# Patient Record
Sex: Male | Born: 2000 | Race: White | Hispanic: No | Marital: Single | Smoking: Current every day smoker
Health system: Southern US, Community
[De-identification: ages and names within clinical notes are randomized; demographics above are authoritative.]

---
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
## 2018-07-17 ENCOUNTER — Encounter (HOSPITAL_COMMUNITY): Payer: Self-pay

## 2018-07-17 ENCOUNTER — Ambulatory Visit: Payer: Self-pay

## 2018-07-17 ENCOUNTER — Emergency Department (HOSPITAL_COMMUNITY)
Admission: EM | Admit: 2018-07-17 | Discharge: 2018-07-17 | Disposition: A | Discharge status: Home / Self Care | Payer: HRSA Program | Attending: Emergency Medicine | Admitting: Emergency Medicine

## 2018-07-17 ENCOUNTER — Other Ambulatory Visit: Payer: Self-pay

## 2018-07-17 ENCOUNTER — Emergency Department (HOSPITAL_COMMUNITY): Payer: HRSA Program

## 2018-07-17 DIAGNOSIS — R197 Diarrhea, unspecified: Secondary | ICD-10-CM | POA: Insufficient documentation

## 2018-07-17 DIAGNOSIS — R05 Cough: Secondary | ICD-10-CM | POA: Insufficient documentation

## 2018-07-17 DIAGNOSIS — R059 Cough, unspecified: Secondary | ICD-10-CM

## 2018-07-17 DIAGNOSIS — Z20828 Contact with and (suspected) exposure to other viral communicable diseases: Secondary | ICD-10-CM | POA: Diagnosis not present

## 2018-07-17 DIAGNOSIS — Z20822 Contact with and (suspected) exposure to covid-19: Secondary | ICD-10-CM

## 2018-07-17 LAB — CBC WITH DIFFERENTIAL/PLATELET
Abs Immature Granulocytes: 0.01 10*3/uL (ref 0.00–0.07)
Basophils Absolute: 0 10*3/uL (ref 0.0–0.1)
Basophils Relative: 1 %
Eosinophils Absolute: 0.1 10*3/uL (ref 0.0–0.5)
Eosinophils Relative: 1 %
HCT: 49.1 % (ref 39.0–52.0)
Hemoglobin: 16.4 g/dL (ref 13.0–17.0)
Immature Granulocytes: 0 %
Lymphocytes Relative: 32 %
Lymphs Abs: 2 10*3/uL (ref 0.7–4.0)
MCH: 29.5 pg (ref 26.0–34.0)
MCHC: 33.4 g/dL (ref 30.0–36.0)
MCV: 88.5 fL (ref 80.0–100.0)
Monocytes Absolute: 0.4 10*3/uL (ref 0.1–1.0)
Monocytes Relative: 6 %
Neutro Abs: 3.8 10*3/uL (ref 1.7–7.7)
Neutrophils Relative %: 60 %
Platelets: 208 10*3/uL (ref 150–400)
RBC: 5.55 MIL/uL (ref 4.22–5.81)
RDW: 12.2 % (ref 11.5–15.5)
WBC: 6.3 10*3/uL (ref 4.0–10.5)
nRBC: 0 % (ref 0.0–0.2)

## 2018-07-17 LAB — COMPREHENSIVE METABOLIC PANEL
ALT: 16 U/L (ref 0–44)
AST: 19 U/L (ref 15–41)
Albumin: 4.7 g/dL (ref 3.5–5.0)
Alkaline Phosphatase: 58 U/L (ref 38–126)
Anion gap: 12 (ref 5–15)
BUN: 14 mg/dL (ref 6–20)
CO2: 22 mmol/L (ref 22–32)
Calcium: 9.6 mg/dL (ref 8.9–10.3)
Chloride: 106 mmol/L (ref 98–111)
Creatinine, Ser: 0.84 mg/dL (ref 0.61–1.24)
GFR calc Af Amer: >60 mL/min (ref >60)
GFR calc non Af Amer: >60 mL/min (ref >60)
Glucose, Bld: 78 mg/dL (ref 70–99)
Potassium: 4 mmol/L (ref 3.5–5.1)
Sodium: 140 mmol/L (ref 135–145)
Total Bilirubin: 0.5 mg/dL (ref 0.3–1.2)
Total Protein: 7.7 g/dL (ref 6.5–8.1)

## 2018-07-17 MED ORDER — SODIUM CHLORIDE 0.9 % IV BOLUS
1000.0000 mL | Freq: Once | INTRAVENOUS | Status: AC
  Administered 2018-07-17: 1000 mL via INTRAVENOUS

## 2018-07-17 NOTE — Discharge Instructions (Signed)
You do have a coronavirus test pending.  Please continue to quarantine at home and monitor your symptoms closely until your test results return in about 2 days.  You chest x-ray was clear and labs look good. Antibiotics are not helpful in treating viral infection, the virus should run its course in about 5-7 days. Please make sure you are drinking plenty of fluids. You can treat your symptoms supportively with tylenol for fevers and pains, and over the counter cough syrups and throat lozenges to help with cough.  For diarrhea if it is worsening you can use Imodium, you can also try taking a probiotic supplement i.  F your symptoms are not improving please follow up with you Primary doctor.   I recommend that you purchase a home pulse ox to help better monitor your oxygen at home, if you start to have increased work of breathing or shortness of breath or your oxygen drops below 90% please immediately return to the hospital for reevaluation.  If you develop persistent fevers, shortness of breath or difficulty breathing, chest pain, severe headache and neck pain, persistent nausea and vomiting or other new or concerning symptoms return to the Emergency department.

## 2018-07-17 NOTE — ED Triage Notes (Signed)
Sore throat since this morning, diarrhea x 10 days, dry cough. Pt has been exposed to [place] at work at [employer] and [employer].

## 2018-07-17 NOTE — ED Notes (Signed)
Pt is calm and relaxing in room at this time. Pt appears comfortable and without distress.Pt reports that he has been having no problems eating and drinking at this time.

## 2018-07-17 NOTE — ED Provider Notes (Signed)
[facility] DEPT Provider Note   CSN: [id number] Arrival date & time: 07/17/18  1357    History   Chief Complaint Chief Complaint  Patient presents with   Cough   Diarrhea    HPI Zachary Oconnor is a 18 y.o. male.     Zachary Oconnor is a 18 y.o. male otherwise healthy, presents to the emergency department for cough, sore throat and diarrhea.  Patient reports he works for [employer] and then has had a few positive COVID cases at work.  He reports diarrhea for the past 10 days which is been nonbloody.  He does report that he occasionally has blood on the toilet paper which is related to his hemorrhoids.  He reports a few days ago he developed a cough and today started to have a sore throat, denies congestion.  He denies chest pain or shortness of breath.  No abdominal pain, nausea or vomiting, has had some chills and fatigue but no fever.  No other aggravating or alleviating factors.  He reports that he 3-4 watery bowel movements per day but is still able to drink and eat normally.     History reviewed. No pertinent past medical history.  There are no active problems to display for this patient.   History reviewed. No pertinent surgical history.      Home Medications    Prior to Admission medications   Medication Sig Start Date End Date Taking? Authorizing Provider  permethrin (ELIMITE) 5 % cream Use as instructed on package 10/20/12   Billy Fischer, MD    Family History No family history on file.  Social History Social History   Tobacco Use   Smoking status: Current Every Day Smoker    Packs/day: 0.50   Smokeless tobacco: Never Used  Substance Use Topics   Alcohol use: No   Drug use: Never     Allergies   Patient has no known allergies.   Review of Systems Review of Systems  Constitutional: Positive for chills and fatigue. Negative for fever.  HENT: Positive for sore throat. Negative for congestion and rhinorrhea.    Respiratory: Positive for cough. Negative for shortness of breath.   Cardiovascular: Negative for chest pain.  Gastrointestinal: Positive for diarrhea. Negative for abdominal pain, blood in stool, constipation, nausea and vomiting.  Genitourinary: Negative for dysuria and frequency.  Musculoskeletal: Positive for myalgias. Negative for arthralgias.  Skin: Negative for color change and rash.  Neurological: Positive for headaches. Negative for weakness and numbness.  All other systems reviewed and are negative.    Physical Exam Updated Vital Signs BP 122/75 (BP Location: Right Arm)    Pulse 69    Temp 98.3 F (36.8 C) (Oral)    Resp 17    Ht 5\' 9"  (1.753 m)    Wt 59 kg    SpO2 98%    BMI 19.20 kg/m   Physical Exam Vitals signs and nursing note reviewed.  Constitutional:      General: He is not in acute distress.    Appearance: Normal appearance. He is well-developed and normal weight. He is not ill-appearing or diaphoretic.  HENT:     Head: Normocephalic and atraumatic.     Nose: Nose normal.     Mouth/Throat:     Mouth: Mucous membranes are moist.     Pharynx: Oropharynx is clear.     Comments: Posterior oropharynx clear and mucous membranes moist, there is mild erythema but no edema or tonsillar exudates, uvula  midline, normal phonation, no trismus, tolerating secretions without difficulty. Eyes:     General:        Right eye: No discharge.        Left eye: No discharge.  Neck:     Musculoskeletal: Neck supple.     Comments: No rigidity Cardiovascular:     Rate and Rhythm: Normal rate and regular rhythm.     Heart sounds: Normal heart sounds.  Pulmonary:     Effort: Pulmonary effort is normal. No respiratory distress.     Breath sounds: Normal breath sounds.     Comments: Respirations equal and unlabored, patient able to speak in full sentences, lungs clear to auscultation bilaterally Abdominal:     General: Bowel sounds are normal. There is no distension.      Palpations: Abdomen is soft. There is no mass.     Tenderness: There is no abdominal tenderness. There is no guarding.     Comments: Abdomen soft, nondistended, nontender to palpation in all quadrants without guarding or peritoneal signs  Musculoskeletal:        General: No deformity.  Lymphadenopathy:     Cervical: No cervical adenopathy.  Skin:    General: Skin is warm and dry.     Capillary Refill: Capillary refill takes less than 2 seconds.  Neurological:     Mental Status: He is alert.  Psychiatric:        Mood and Affect: Mood normal.        Behavior: Behavior normal.      ED Treatments / Results  Labs (all labs ordered are listed, but only abnormal results are displayed) Labs Reviewed  NOVEL CORONAVIRUS, NAA (HOSPITAL ORDER, SEND-OUT TO REF LAB)  COMPREHENSIVE METABOLIC PANEL  CBC WITH DIFFERENTIAL/PLATELET    EKG None  Radiology Dg Chest Portable 1 View  Result Date: 07/17/2018 CLINICAL DATA:  Cough EXAM: PORTABLE CHEST 1 VIEW COMPARISON:  None. FINDINGS: Lungs are clear. Heart size and pulmonary vascularity are normal. No adenopathy. No bone lesions. IMPRESSION: No edema or consolidation. Electronically Signed   By: Bretta BangWilliam  Woodruff III M.D.   On: 07/17/2018 16:05    Procedures Procedures (including critical care time)  Medications Ordered in ED Medications  sodium chloride 0.9 % bolus 1,000 mL (0 mLs Intravenous Stopped 07/17/18 1917)     Initial Impression / Assessment and Plan / ED Course  I have reviewed the triage vital signs and the nursing notes.  Pertinent labs & imaging results that were available during my care of the patient were reviewed by me and considered in my medical decision making (see chart for details).  Patient presents with cough, sore throat diarrhea.  Patient has had positive COVID cases at work.  Has had some chills and fatigue no measurable fever, vitals normal on arrival.  Reports 10 days of persistent diarrhea, nonbloody.  No  associated abdominal pain nausea or vomiting.  No shortness of, lungs are clear on exam and abdominal exam is benign.  Patient may very well have coronavirus given his symptoms, will send 48-hour send out test we will also check chest x-ray and basic labs to assess for any electrolyte derangements given persistent diarrhea.  Given reassuring abdominal exam do not think that any abdominal imaging is indicated.  Labs very reassuring, no hypokalemia or other electrolyte derangements, no leukocytosis, chest x-ray is clear.  Patient has been given IV fluids and Tylenol but he is feeling better.  Patient is stable for discharge home, I have discussed  continuing home quarantine until results from included test returned.  I have also recommended patient use Imodium and/or a probiotic supplement to help control diarrhea.  PC.  Follow-up if not improving.  Return precautions discussed.  Patient expresses understanding and agreement with plan.  Discharged home in good condition.  Swazil[place]Jordan Oconnor was evaluated in Emergency Department on 07/17/2018 for the symptoms described in the history of present illness. He was evaluated in the context of the global COVID-19 pandemic, which necessitated consideration that the patient might be at risk for infection with the SARS-CoV-2 virus that causes COVID-19. Institutional protocols and algorithms that pertain to the evaluation of patients at risk for COVID-19 are in a state of rapid change based on information released by regulatory bodies including the CDC and federal and state organizations. These policies and algorithms were followed during the patient's care in the ED.   Final Clinical Impressions(s) / ED Diagnoses   Final diagnoses:  Cough  Diarrhea, unspecified type  Suspected Covid-19 Virus Infection    ED Discharge Orders    None       Dartha LodgeFord, Lucindy Borel N, [place] JerseyPA-C 07/19/18 1159    Vanetta MuldersZackowski, Scott, MD 07/20/18 [phone]

## 2018-07-17 NOTE — Telephone Encounter (Signed)
Pt. Called to report onset of diarrhea about 5-6 days ago; reported about 3 watery stools/ day.  C/o onset of cough and intermittent right upper chest pain, 2 days ago.  Stated the chest pain occurs sporadically, and lasts approx. 5 min. Intervals.  C/o sore throat that started overnight.  Reported is feeling worse each day.   Reported he started working for [employer], within last 2 weeks, and was advised of 2 employees, that have recently tested positive, for [place].  Denied fever, body aches, or shortness of breath.  Stated he does not have a PCP, and does not have insurance.  Stated he thinks he may still be covered under Medicaid, but is not sure of this.  Advised he should be evaluated at [facility] or ER.  Encouraged to go to [facility] ER for evaluation.  Advised he should wear a mask to the hospital.  Verb. Understanding.  Agrees with plan.   Called [facility] ER; spoke with Erline Levine; gave report on patient's symptoms, and that the pt. Is coming to the ER now for evaluation.     Reason for Disposition . Chest pain or pressure  Answer Assessment - Initial Assessment Questions 1. COVID-19 DIAGNOSIS: "Who made your Coronavirus (COVID-19) diagnosis?" "Was it confirmed by a positive lab test?" If not diagnosed by a HCP, ask "Are there lots of cases (community spread) where you live?" (See public health department website, if unsure)     2 known cases of employees testing positive 2. ONSET: "When did the COVID-19 symptoms start?"      5-6 days ago  3. WORST SYMPTOM: "What is your worst symptom?" (e.g., cough, fever, shortness of breath, muscle aches)     Diarrhea, sore throat 4. COUGH: "Do you have a cough?" If so, ask: "How bad is the cough?"       Mild cough; dry 5. FEVER: "Do you have a fever?" If so, ask: "What is your temperature, how was it measured, and when did it start?"    Denied fever, chills, body aches  6. RESPIRATORY STATUS: "Describe your breathing?" (e.g., shortness of breath, wheezing, unable to  speak)      Denied wheezing, or shortness of breath 7. BETTER-SAME-WORSE: "Are you getting better, staying the same or getting worse compared to yesterday?"  If getting worse, ask, "In what way?"     Feeling worse each day 8. HIGH RISK DISEASE: "Do you have any chronic medical problems?" (e.g., asthma, heart or lung disease, weak immune system, etc.)     Hx of stomach problems;  9. PREGNANCY: "Is there any chance you are pregnant?" "When was your last menstrual period?"     N/a  10. OTHER SYMPTOMS: "Do you have any other symptoms?"  (e.g., chills, fatigue, headache, loss of smell or taste, muscle pain, sore throat)       Intermittent upper right chest pain that lasts for about 5 minutes, cough, sore throat, watery stools 3 x/day for 5-6 days.  Protocols used: CORONAVIRUS (COVID-19) DIAGNOSED OR SUSPECTED-A-AH

## 2018-07-18 NOTE — ED Notes (Signed)
wanted results of labs

## 2018-07-19 LAB — NOVEL CORONAVIRUS, NAA (HOSP ORDER, SEND-OUT TO REF LAB; TAT 18-24 HRS): SARS-CoV-2, NAA: NOT DETECTED

## 2018-07-19 NOTE — ED Notes (Signed)
Pt called with questions regarding his labs. Opened chart to review with him.

## 2020-12-19 IMAGING — DX PORTABLE CHEST - 1 VIEW
1 series · 1 of 1 positions shown · non-contrast
Comparison: None.

CLINICAL DATA: Cough

EXAM:
PORTABLE CHEST 1 VIEW

[chest ap]
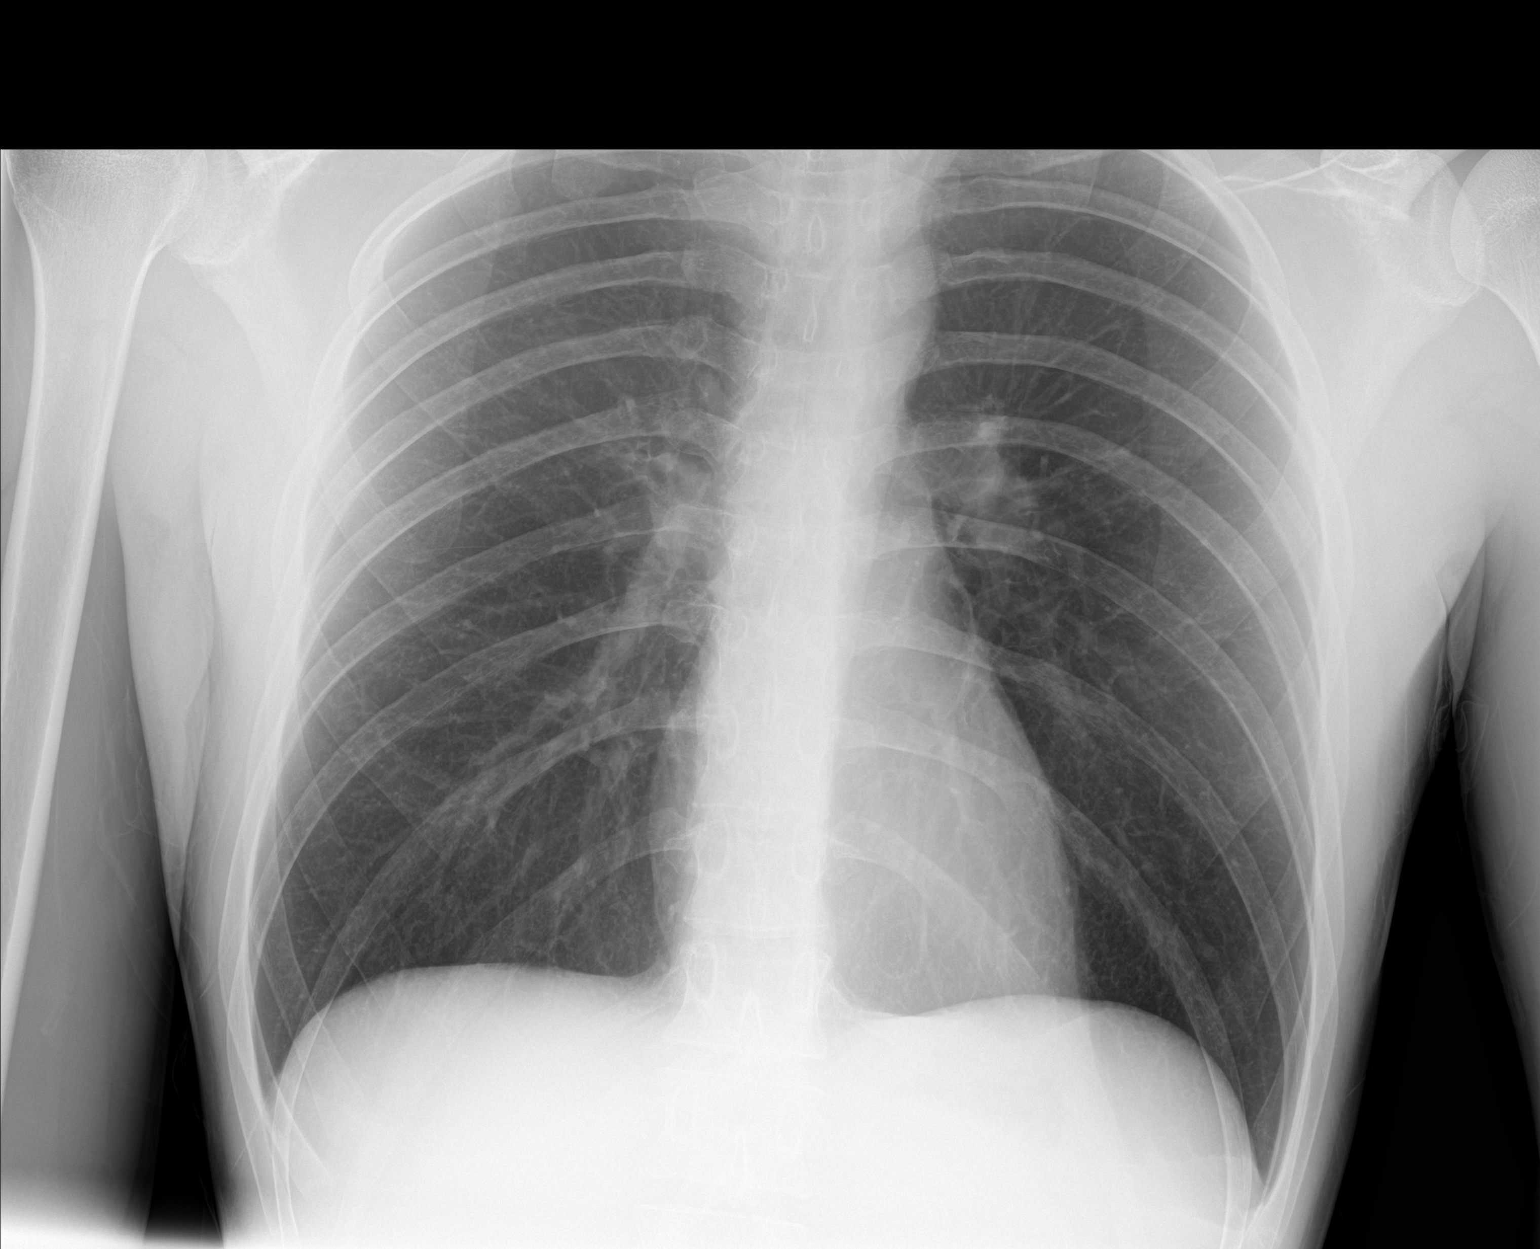

[1 of 1 positions shown; findings below may reference images not displayed]

FINDINGS: Lungs are clear. Heart size and pulmonary vascularity are normal. No
adenopathy. No bone lesions.
IMPRESSION: No edema or consolidation.
